# Patient Record
Sex: Male | Born: 1985 | Race: Black or African American | Hispanic: No | Smoking: Current every day smoker
Health system: Southern US, Community
[De-identification: ages and names within clinical notes are randomized; demographics above are authoritative.]

## PROBLEM LIST (undated history)

## (undated) DIAGNOSIS — F29 Unspecified psychosis not due to a substance or known physiological condition: Secondary | ICD-10-CM

## (undated) HISTORY — PX: HAND SURGERY: SHX662

---
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
## 2006-02-19 ENCOUNTER — Emergency Department (HOSPITAL_COMMUNITY): Admission: EM | Admit: 2006-02-19 | Discharge: 2006-02-19 | Payer: Self-pay | Admitting: Emergency Medicine

## 2011-10-08 ENCOUNTER — Emergency Department (HOSPITAL_COMMUNITY)
Admission: EM | Admit: 2011-10-08 | Discharge: 2011-10-08 | Disposition: A | Discharge status: Home / Self Care | Payer: Medicare Other | Attending: Emergency Medicine | Admitting: Emergency Medicine

## 2011-10-08 ENCOUNTER — Encounter (HOSPITAL_COMMUNITY): Payer: Self-pay | Admitting: *Deleted

## 2011-10-08 DIAGNOSIS — F17200 Nicotine dependence, unspecified, uncomplicated: Secondary | ICD-10-CM | POA: Insufficient documentation

## 2011-10-08 DIAGNOSIS — L089 Local infection of the skin and subcutaneous tissue, unspecified: Secondary | ICD-10-CM

## 2011-10-08 MED ORDER — CEPHALEXIN 500 MG PO CAPS: 500.0000 mg | ORAL_CAPSULE | Freq: Four times a day (QID) | ORAL | Status: AC

## 2011-10-08 NOTE — ED Notes (Signed)
Hand soaking in cold water in attempt to reduce the swelling, pt updated with plan of care.

## 2011-10-08 NOTE — ED Provider Notes (Signed)
History     CSN: [id number]  Arrival date & time 10/08/11  1317   First MD Initiated Contact with Patient 10/08/11 1456      Chief Complaint  Patient presents with  . Insect Bite    (Consider location/radiation/quality/duration/timing/severity/associated sxs/prior treatment) HPI Comments: Pt was bit on R 4th finger yest.  Awakened with swelling and can't remove ring.  The history is provided by the patient. No language interpreter was used.    History reviewed. No pertinent past medical history.  Past Surgical History  Procedure Date  . Hand surgery     History reviewed. No pertinent family history.  History  Substance Use Topics  . Smoking status: Current Everyday Smoker  . Smokeless tobacco: Not on file  . Alcohol Use: Yes     occ      Review of Systems  Allergies  Review of patient's allergies indicates no known allergies.  Home Medications  No current outpatient prescriptions on file.  BP 101/57  Pulse 66  Temp 97.9 F (36.6 C) (Oral)  Resp 17  Ht 5\' 8"  (1.727 m)  Wt 150 lb (68.04 kg)  BMI 22.81 kg/m2  SpO2 98%  Physical Exam  Nursing note and vitals reviewed. Constitutional: He is oriented to person, place, and time. He appears well-developed and well-nourished.  HENT:  Head: Normocephalic and atraumatic.  Eyes: EOM are normal.  Neck: Normal range of motion.  Cardiovascular: Normal rate, regular rhythm, normal heart sounds and intact distal pulses.   Pulmonary/Chest: Effort normal and breath sounds normal. No respiratory distress.  Abdominal: Soft. He exhibits no distension. There is no tenderness.  Musculoskeletal: Normal range of motion.       Hands: Neurological: He is alert and oriented to person, place, and time.  Skin: Skin is warm and dry.  Psychiatric: He has a normal mood and affect. Judgment normal.    ED Course  Procedures (including critical care time)  Labs Reviewed - No data to display No results found.   1. Infected  insect bite of finger    Ring is being removed with ring cutter.   MDM  rx-keflex 500 mg, QID, 28         Evalina Field, [place] 10/08/11 1623  Evalina Field, [place] 10/08/11 1625

## 2011-10-08 NOTE — ED Notes (Signed)
Ring was able to be cut off, pt tolerated well ring given to pt

## 2011-10-08 NOTE — ED Notes (Signed)
Insect bite to Rt ring finger,  Swelling , has a  Ring on finger that he cannot remove

## 2011-10-08 NOTE — ED Notes (Signed)
Pt has swelling to right ring finger at knuckle area, pt states that he thinks that he was bitten by a ?spider yesterday, area has clear drainage noted

## 2011-10-13 NOTE — ED Provider Notes (Signed)
Medical screening examination/treatment/procedure(s) were performed by non-physician practitioner and as supervising physician I was immediately available for consultation/collaboration.  Donnetta Hutching, MD 10/13/11 1116

## 2015-07-16 ENCOUNTER — Encounter: Payer: Self-pay | Admitting: Emergency Medicine

## 2015-07-16 ENCOUNTER — Emergency Department
Admission: EM | Admit: 2015-07-16 | Discharge: 2015-07-16 | Disposition: A | Discharge status: Home / Self Care | Payer: Medicare Other | Attending: Emergency Medicine | Admitting: Emergency Medicine

## 2015-07-16 ENCOUNTER — Emergency Department: Payer: Medicare Other

## 2015-07-16 DIAGNOSIS — R300 Dysuria: Secondary | ICD-10-CM | POA: Diagnosis not present

## 2015-07-16 DIAGNOSIS — F17200 Nicotine dependence, unspecified, uncomplicated: Secondary | ICD-10-CM | POA: Insufficient documentation

## 2015-07-16 DIAGNOSIS — R109 Unspecified abdominal pain: Secondary | ICD-10-CM | POA: Diagnosis present

## 2015-07-16 DIAGNOSIS — F29 Unspecified psychosis not due to a substance or known physiological condition: Secondary | ICD-10-CM | POA: Insufficient documentation

## 2015-07-16 DIAGNOSIS — N201 Calculus of ureter: Secondary | ICD-10-CM | POA: Diagnosis not present

## 2015-07-16 HISTORY — DX: Unspecified psychosis not due to a substance or known physiological condition: F29

## 2015-07-16 LAB — URINALYSIS COMPLETE WITH MICROSCOPIC (ARMC ONLY)
Glucose, UA: NEGATIVE mg/dL
Hgb urine dipstick: NEGATIVE
Leukocytes, UA: NEGATIVE
Nitrite: NEGATIVE
Protein, ur: 30 mg/dL — AB
Specific Gravity, Urine: 1.035 — ABNORMAL HIGH (ref 1.005–1.030)
Squamous Epithelial / LPF: NONE SEEN
pH: 5 (ref 5.0–8.0)

## 2015-07-16 LAB — CBC
HCT: 39.7 % — ABNORMAL LOW (ref 40.0–52.0)
Hemoglobin: 13.5 g/dL (ref 13.0–18.0)
MCH: 27 pg (ref 26.0–34.0)
MCHC: 33.9 g/dL (ref 32.0–36.0)
MCV: 79.7 fL — ABNORMAL LOW (ref 80.0–100.0)
Platelets: 331 10*3/uL (ref 150–440)
RBC: 4.98 MIL/uL (ref 4.40–5.90)
RDW: 14.8 % — ABNORMAL HIGH (ref 11.5–14.5)
WBC: 12.9 10*3/uL — ABNORMAL HIGH (ref 3.8–10.6)

## 2015-07-16 LAB — COMPREHENSIVE METABOLIC PANEL
ALT: 44 U/L (ref 17–63)
AST: 28 U/L (ref 15–41)
Albumin: 4.1 g/dL (ref 3.5–5.0)
Alkaline Phosphatase: 104 U/L (ref 38–126)
Anion gap: 13 (ref 5–15)
BUN: 13 mg/dL (ref 6–20)
CO2: 22 mmol/L (ref 22–32)
Calcium: 9.3 mg/dL (ref 8.9–10.3)
Chloride: 105 mmol/L (ref 101–111)
Creatinine, Ser: 1.17 mg/dL (ref 0.61–1.24)
GFR calc Af Amer: >60 mL/min (ref >60)
GFR calc non Af Amer: >60 mL/min (ref >60)
Glucose, Bld: 116 mg/dL — ABNORMAL HIGH (ref 65–99)
Potassium: 3.7 mmol/L (ref 3.5–5.1)
Sodium: 140 mmol/L (ref 135–145)
Total Bilirubin: 0.2 mg/dL — ABNORMAL LOW (ref 0.3–1.2)
Total Protein: 7.3 g/dL (ref 6.5–8.1)

## 2015-07-16 LAB — TROPONIN I: Troponin I: <0.03 ng/mL (ref <0.031)

## 2015-07-16 LAB — LIPASE, BLOOD: Lipase: 16 U/L (ref 11–51)

## 2015-07-16 MED ORDER — KETOROLAC TROMETHAMINE 30 MG/ML IJ SOLN
30.0000 mg | Freq: Once | INTRAMUSCULAR | Status: AC
  Administered 2015-07-16: 30 mg via INTRAVENOUS
  Filled 2015-07-16: qty 1

## 2015-07-16 MED ORDER — ONDANSETRON HCL 4 MG PO TABS: 4.0000 mg | ORAL_TABLET | Freq: Every day | ORAL | Status: AC | PRN

## 2015-07-16 MED ORDER — TAMSULOSIN HCL 0.4 MG PO CAPS: 0.4000 mg | ORAL_CAPSULE | Freq: Every day | ORAL | Status: AC

## 2015-07-16 MED ORDER — HYDROCODONE-ACETAMINOPHEN 5-325 MG PO TABS
1.0000 | ORAL_TABLET | ORAL | Status: AC | PRN
Start: 2015-07-16 — End: ?

## 2015-07-16 MED ORDER — NAPROXEN 500 MG PO TABS: 500.0000 mg | ORAL_TABLET | Freq: Two times a day (BID) | ORAL | Status: AC

## 2015-07-16 NOTE — ED Notes (Signed)
Pt presents to ED with LLQ pain that has become increasingly worse through the day. Pt also reports urinary frequency and urgency. Pt states he is not able to produce much urine when he does have to go. Pt reports N/V/D that started today as well. Pt denies flank pain.

## 2015-07-16 NOTE — Discharge Instructions (Signed)
Kidney Stones °Kidney stones (urolithiasis) are deposits that form inside your kidneys. The intense pain is caused by the stone moving through the urinary tract. When the stone moves, the ureter goes into spasm around the stone. The stone is usually passed in the urine.  °CAUSES  °· A disorder that makes certain neck glands produce too much parathyroid hormone (primary hyperparathyroidism). °· A buildup of uric acid crystals, similar to gout in your joints. °· Narrowing (stricture) of the ureter. °· A kidney obstruction present at birth (congenital obstruction). °· Previous surgery on the kidney or ureters. °· Numerous kidney infections. °SYMPTOMS  °· Feeling sick to your stomach (nauseous). °· Throwing up (vomiting). °· Blood in the urine (hematuria). °· Pain that usually spreads (radiates) to the groin. °· Frequency or urgency of urination. °DIAGNOSIS  °· Taking a history and physical exam. °· Blood or urine tests. °· CT scan. °· Occasionally, an examination of the inside of the urinary bladder (cystoscopy) is performed. °TREATMENT  °· Observation. °· Increasing your fluid intake. °· Extracorporeal shock wave lithotripsy--This is a noninvasive procedure that uses shock waves to break up kidney stones. °· Surgery may be needed if you have severe pain or persistent obstruction. There are various surgical procedures. Most of the procedures are performed with the use of small instruments. Only small incisions are needed to accommodate these instruments, so recovery time is minimized. °The size, location, and chemical composition are all important variables that will determine the proper choice of action for you. Talk to your health care provider to better understand your situation so that you will minimize the risk of injury to yourself and your kidney.  °HOME CARE INSTRUCTIONS  °· Drink enough water and fluids to keep your urine clear or pale yellow. This will help you to pass the stone or stone fragments. °· Strain  all urine through the provided strainer. Keep all particulate matter and stones for your health care provider to see. The stone causing the pain may be as small as a grain of salt. It is very important to use the strainer each and every time you pass your urine. The collection of your stone will allow your health care provider to analyze it and verify that a stone has actually passed. The stone analysis will often identify what you can do to reduce the incidence of recurrences. °· Only take over-the-counter or prescription medicines for pain, discomfort, or fever as directed by your health care provider. °· Keep all follow-up visits as told by your health care provider. This is important. °· Get follow-up X-rays if required. The absence of pain does not always mean that the stone has passed. It may have only stopped moving. If the urine remains completely obstructed, it can cause loss of kidney function or even complete destruction of the kidney. It is your responsibility to make sure X-rays and follow-ups are completed. Ultrasounds of the kidney can show blockages and the status of the kidney. Ultrasounds are not associated with any radiation and can be performed easily in a matter of minutes. °· Make changes to your daily diet as told by your health care provider. You may be told to: °¨ Limit the amount of salt that you eat. °¨ Eat 5 or more servings of fruits and vegetables each day. °¨ Limit the amount of meat, poultry, fish, and eggs that you eat. °· Collect a 24-hour urine sample as told by your health care provider. You may need to collect another urine sample every 6-12   months. °SEEK MEDICAL CARE IF: °· You experience pain that is progressive and unresponsive to any pain medicine you have been prescribed. °SEEK IMMEDIATE MEDICAL CARE IF:  °· Pain cannot be controlled with the prescribed medicine. °· You have a fever or shaking chills. °· The severity or intensity of pain increases over 18 hours and is not  relieved by pain medicine. °· You develop a new onset of abdominal pain. °· You feel faint or pass out. °· You are unable to urinate. °  °This information is not intended to replace advice given to you by your health care provider. Make sure you discuss any questions you have with your health care provider. °  °Document Released: 03/05/2005 Document Revised: 11/24/2014 Document Reviewed: 08/06/2012 °Elsevier Interactive Patient Education ©2016 [employer] ° °

## 2015-07-16 NOTE — ED Provider Notes (Signed)
[facility] Emergency Department Provider Note  ____________________________________________    I have reviewed the triage vital signs and the nursing notes.   HISTORY  Chief Complaint Abdominal Pain and Dysuria    HPI Jeff CurlJoshua B Lukasiewicz is a 30 y.o. male who presents with moderate to severe cramping right periumbilical pain that started approximately 5 hours ago and has become increasingly worse. He denies nausea or vomiting. No history of abdominal surgery. He denies dysuria to me. Normal stools. No injury to the area. No penile discharge. No hematuria.     Past Medical History  Diagnosis Date  . Psychosis     There are no active problems to display for this patient.   Past Surgical History  Procedure Laterality Date  . Hand surgery      Current Outpatient Rx  Name  Route  Sig  Dispense  Refill  . paliperidone (INVEGA SUSTENNA) 156 MG/ML SUSP injection   Intramuscular   Inject 156 mg into the muscle every 30 (thirty) days.           Allergies Review of patient's allergies indicates no known allergies.  No family history on file.  Social History Social History  Substance Use Topics  . Smoking status: Current Every Day Smoker  . Smokeless tobacco: None  . Alcohol Use: Yes     Comment: occ    Review of Systems  Constitutional: Negative for fever. Eyes: Negative for redness ENT: Negative for sore throat Cardiovascular: Negative for chest pain Respiratory: Negative for Cough Gastrointestinal: As above Genitourinary: Negative for dysuria. Musculoskeletal: Negative for back pain. Skin: Negative for rash. Neurological: Negative for headache Psychiatric: Mild anxiety    ____________________________________________   PHYSICAL EXAM:  VITAL SIGNS: ED Triage Vitals  Enc Vitals Group     BP 07/16/15 1824 110/92 mmHg     Pulse Rate 07/16/15 1824 89     Resp 07/16/15 1824 18     Temp 07/16/15 1824 98.7 F (37.1 C)     Temp  Source 07/16/15 1824 Oral     SpO2 07/16/15 1824 99 %     Weight 07/16/15 1824 240 lb (108.863 kg)     Height 07/16/15 1824 5\' 10"  (1.778 m)     Head Cir --      Peak Flow --      Pain Score 07/16/15 1824 9     Pain Loc --      Pain Edu? --      Excl. in GC? --    Constitutional: Alert and oriented. Well appearing and in no distress.  Eyes: Conjunctivae are normal. No erythema or injection ENT   Head: Normocephalic and atraumatic.   Mouth/Throat: Mucous membranes are moist. Cardiovascular: Normal rate, regular rhythm. Normal and symmetric distal pulses are present in the upper extremities.  Respiratory: Normal respiratory effort without tachypnea nor retractions.   Gastrointestinal: Soft and non-tender in all quadrants. No distention. There is no CVA tenderness. Genitourinary: deferred Musculoskeletal: Nontender with normal range of motion in all extremities. No lower extremity tenderness nor edema. Neurologic:  Normal speech and language. No gross focal neurologic deficits are appreciated. Skin:  Skin is warm, dry and intact. No rash noted. Psychiatric: Mood and affect are normal. Patient exhibits appropriate insight and judgment.  ____________________________________________    LABS (pertinent positives/negatives)  Labs Reviewed  COMPREHENSIVE METABOLIC PANEL - Abnormal; Notable for the following:    Glucose, Bld 116 (*)    Total Bilirubin 0.2 (*)  All other components within normal limits  CBC - Abnormal; Notable for the following:    WBC 12.9 (*)    HCT 39.7 (*)    MCV 79.7 (*)    RDW 14.8 (*)    All other components within normal limits  URINALYSIS COMPLETEWITH MICROSCOPIC ([facility] ONLY) - Abnormal; Notable for the following:    Color, Urine AMBER (*)    APPearance CLEAR (*)    Bilirubin Urine 1+ (*)    Ketones, ur TRACE (*)    Specific Gravity, Urine 1.035 (*)    Protein, ur 30 (*)    Bacteria, UA RARE (*)    All other components within normal limits   LIPASE, BLOOD  TROPONIN I    ____________________________________________   EKG  ED ECG REPORT I, Jene Every, the attending physician, personally viewed and interpreted this ECG.  Date: 07/16/2015 EKG Time: 6:33 PM Rate: 91 Rhythm: normal sinus rhythm QRS Axis: normal Intervals: normal ST/T Wave abnormalities: normal Conduction Disturbances: none Narrative Interpretation: unremarkable   ____________________________________________    RADIOLOGY  CT is consistent with right UVJ stone  ____________________________________________   PROCEDURES  Procedure(s) performed: none  Critical Care performed:none  ____________________________________________   INITIAL IMPRESSION / ASSESSMENT AND PLAN / ED COURSE  Pertinent labs & imaging results that were available during my care of the patient were reviewed by me and considered in my medical decision making (see chart for details).  Patient presents with right periumbilical pain which he reports is been constant for approximate 4-5 hours. He has no tenderness to palpation in the area. No hernias felt. No nausea or vomiting. We will give IM Toradol and sent for CT renal stone study to evaluate further. Positive calcium oxalate crystals on urine but negative for hematuria  CT consistent with kidney stone, patient is pain-free after Toradol. I will discharge him with pain medications and Zofran. Urinalysis is benign, heart rate and blood pressure normal. Afebrile  ____________________________________________   FINAL CLINICAL IMPRESSION(S) / ED DIAGNOSES  Final diagnoses:  Flank pain, acute  Ureterolithiasis          Jene Every, MD 07/16/15 2204

## 2016-09-20 IMAGING — CT CT RENAL STONE PROTOCOL
1 of 2 series · 16 of 32 positions shown, 20 images · non-contrast
Comparison: None.

CLINICAL DATA: Acute right flank pain.

EXAM:
CT ABDOMEN AND PELVIS WITHOUT CONTRAST
TECHNIQUE: Multidetector CT imaging of the abdomen and pelvis was performed
following the standard protocol without IV contrast.

[Series 2: stone standard full · axial · 0.78mm/px · z∈[-502,-36]mm · 16 of 103 slices shown, 20 images]
[im 5/103  soft-tissue]
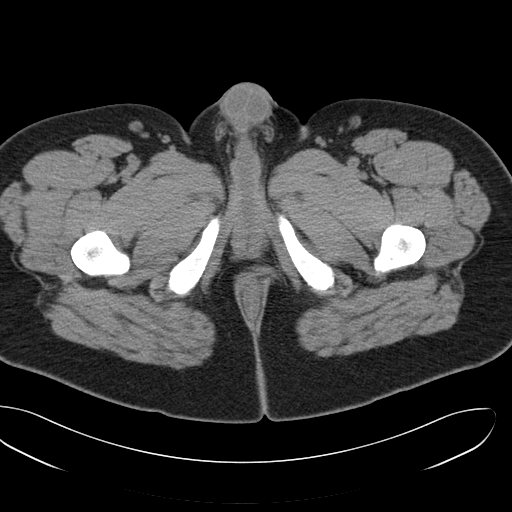
[im 5/103  bone]
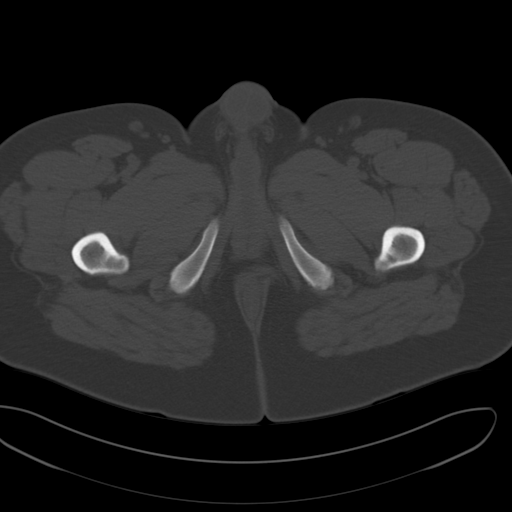
[im 13/103  soft-tissue]
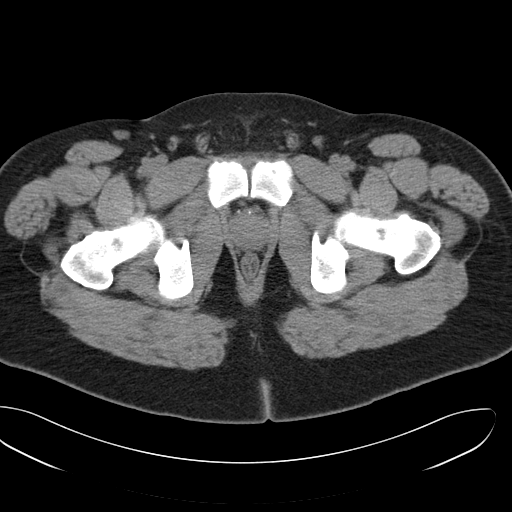
[im 22/103  soft-tissue]
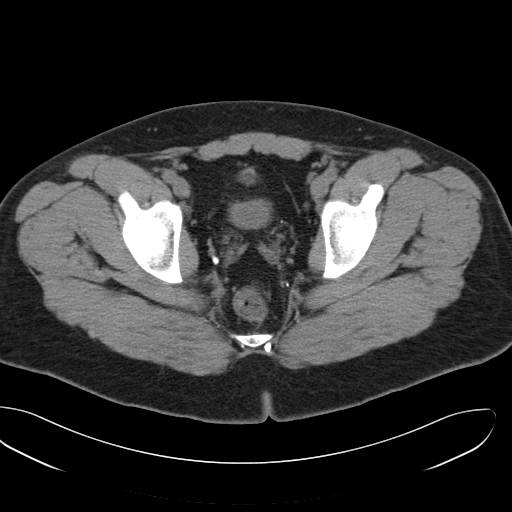
[im 26/103  soft-tissue]
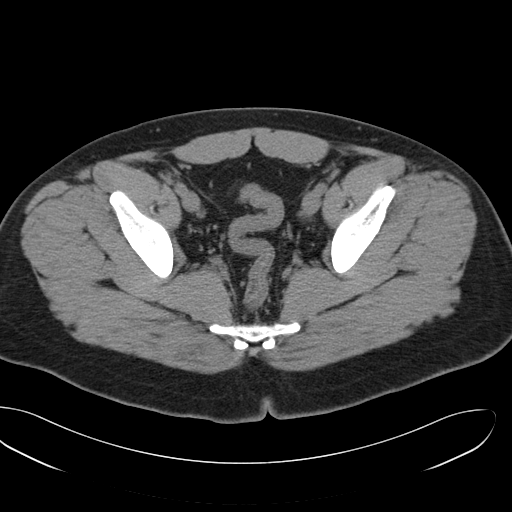
[im 35/103  soft-tissue]
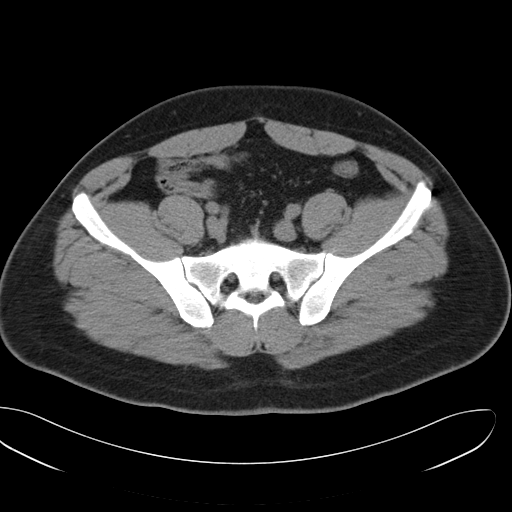
[im 43/103  soft-tissue]
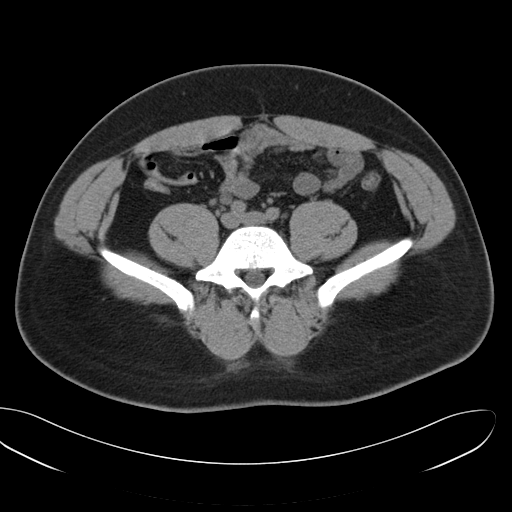
[im 47/103  soft-tissue]
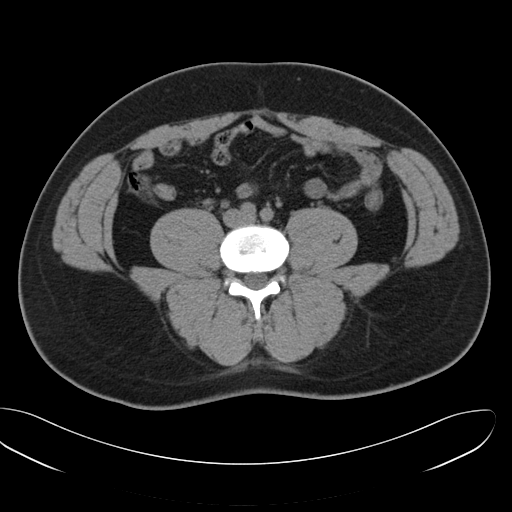
[im 56/103  soft-tissue]
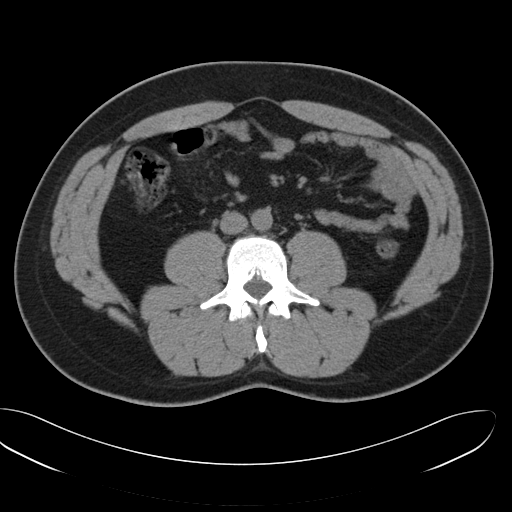
[im 60/103  soft-tissue]
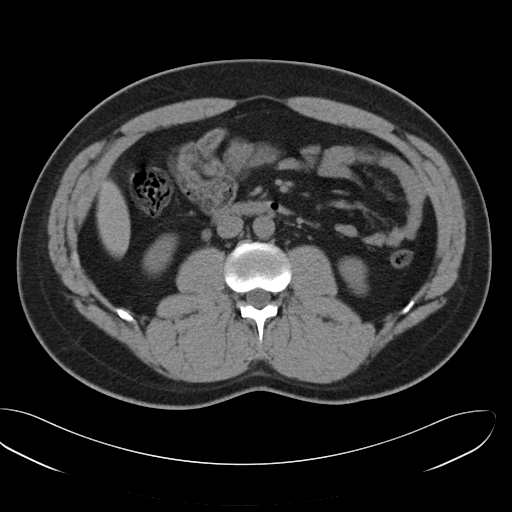
[im 60/103  bone]
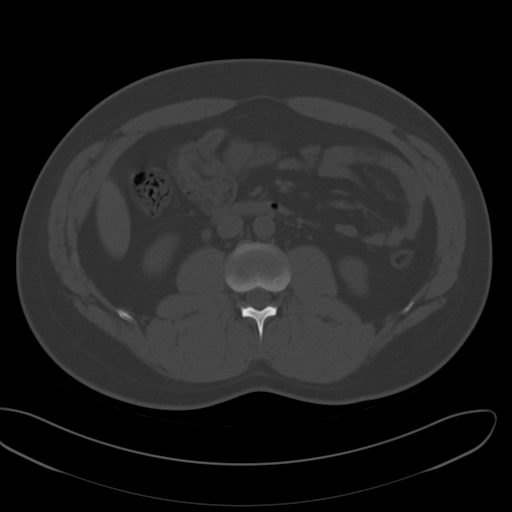
[im 69/103  soft-tissue]
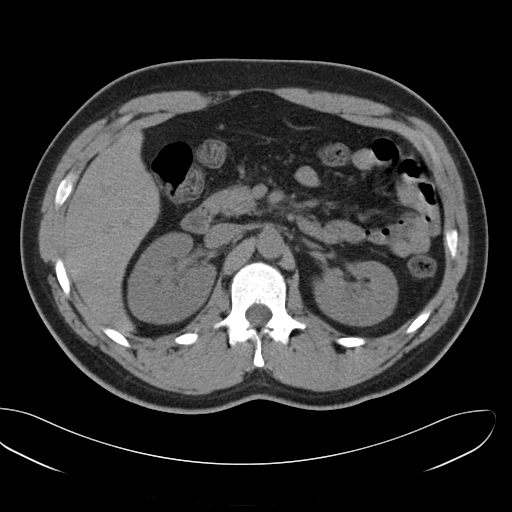
[im 77/103  soft-tissue]
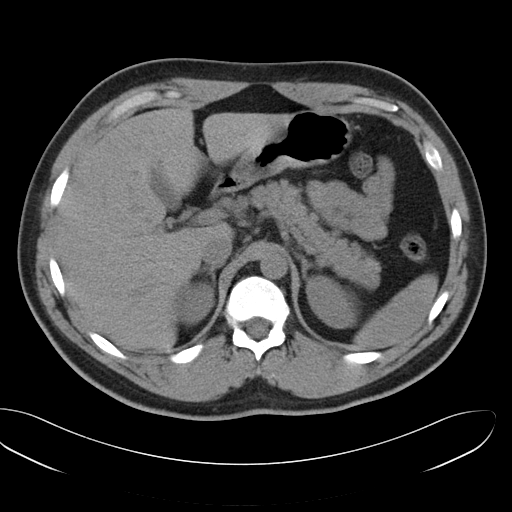
[im 81/103  soft-tissue]
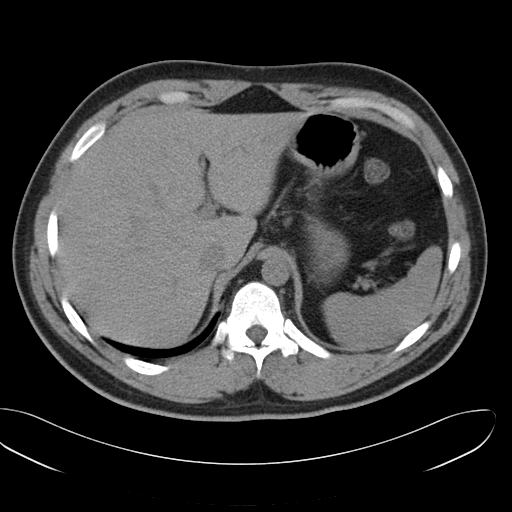
[im 86/103  lung]
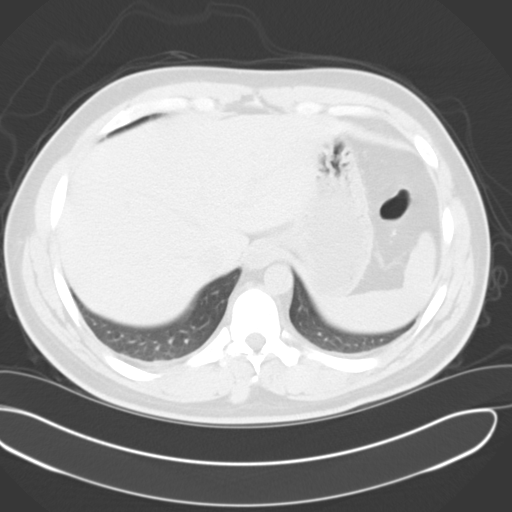
[im 90/103  soft-tissue]
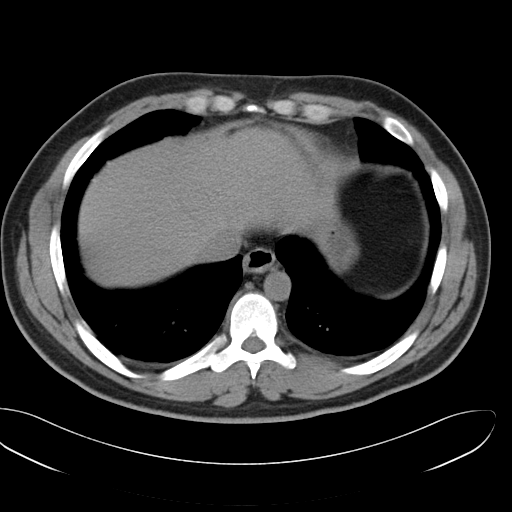
[im 90/103  lung]
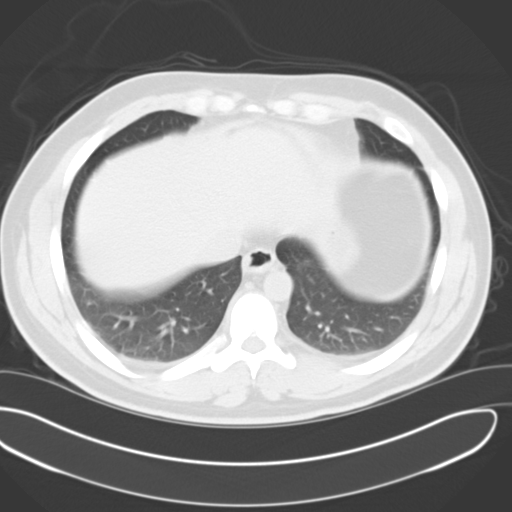
[im 94/103  lung]
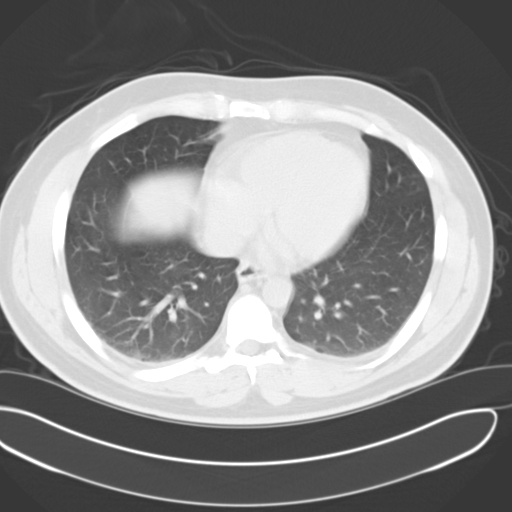
[im 98/103  soft-tissue]
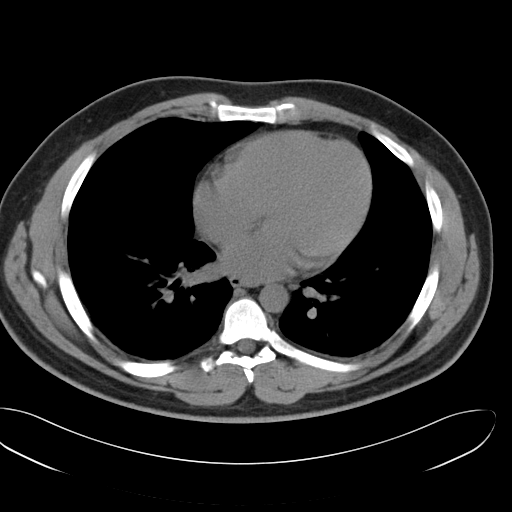
[im 98/103  lung]
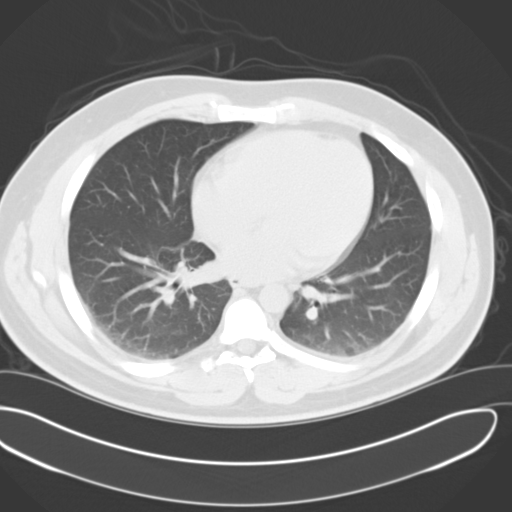

[16 of 32 positions shown; findings below may reference images not displayed]

FINDINGS: No focal abnormality is noted in the liver, spleen or pancreas on
these unenhanced images. Adrenal glands appear normal. Bilateral
nephrolithiasis is noted. Mild right hydroureteronephrosis is noted
secondary to 2 mm calculus at right ureterovesical junction. The
appendix appears normal. There is no evidence of bowel obstruction.
No abnormal fluid collection is noted. No significant adenopathy is
noted.
IMPRESSION: Bilateral nephrolithiasis. Mild right hydroureteronephrosis
secondary to 2 mm calculus at right ureterovesical junction.

## 2021-05-04 ENCOUNTER — Ambulatory Visit: Payer: Medicare Other | Admitting: Podiatry
